# Patient Record
Sex: Male | Born: 1955 | Hispanic: Yes | Marital: Married | Smoking: Never smoker
Health system: Southern US, Community
[De-identification: ages and names within clinical notes are randomized; demographics above are authoritative.]

## PROBLEM LIST (undated history)

## (undated) DIAGNOSIS — K635 Polyp of colon: Secondary | ICD-10-CM

## (undated) DIAGNOSIS — K5790 Diverticulosis of intestine, part unspecified, without perforation or abscess without bleeding: Secondary | ICD-10-CM

## (undated) DIAGNOSIS — D369 Benign neoplasm, unspecified site: Secondary | ICD-10-CM

## (undated) DIAGNOSIS — K5792 Diverticulitis of intestine, part unspecified, without perforation or abscess without bleeding: Secondary | ICD-10-CM

## (undated) HISTORY — DX: Benign neoplasm, unspecified site: D36.9

## (undated) HISTORY — DX: Diverticulosis of intestine, part unspecified, without perforation or abscess without bleeding: K57.90

## (undated) HISTORY — DX: Polyp of colon: K63.5

## (undated) HISTORY — DX: Diverticulitis of intestine, part unspecified, without perforation or abscess without bleeding: K57.92

---

## 2010-07-19 ENCOUNTER — Other Ambulatory Visit: Payer: Self-pay | Admitting: Specialist

## 2010-07-19 ENCOUNTER — Ambulatory Visit
Admission: RE | Admit: 2010-07-19 | Discharge: 2010-07-19 | Disposition: A | Discharge status: Home / Self Care | Payer: BC Managed Care – PPO | Source: Ambulatory Visit | Attending: Specialist | Admitting: Specialist

## 2010-07-19 DIAGNOSIS — M25539 Pain in unspecified wrist: Secondary | ICD-10-CM

## 2012-02-09 IMAGING — CR DG WRIST COMPLETE 3+V*L*
3 series · 3 of 3 positions shown · non-contrast
Comparison: None.

CLINICAL DATA: Left wrist pain

LEFT WRIST - COMPLETE 3+ VIEW

[view not recorded (1 of 3)]
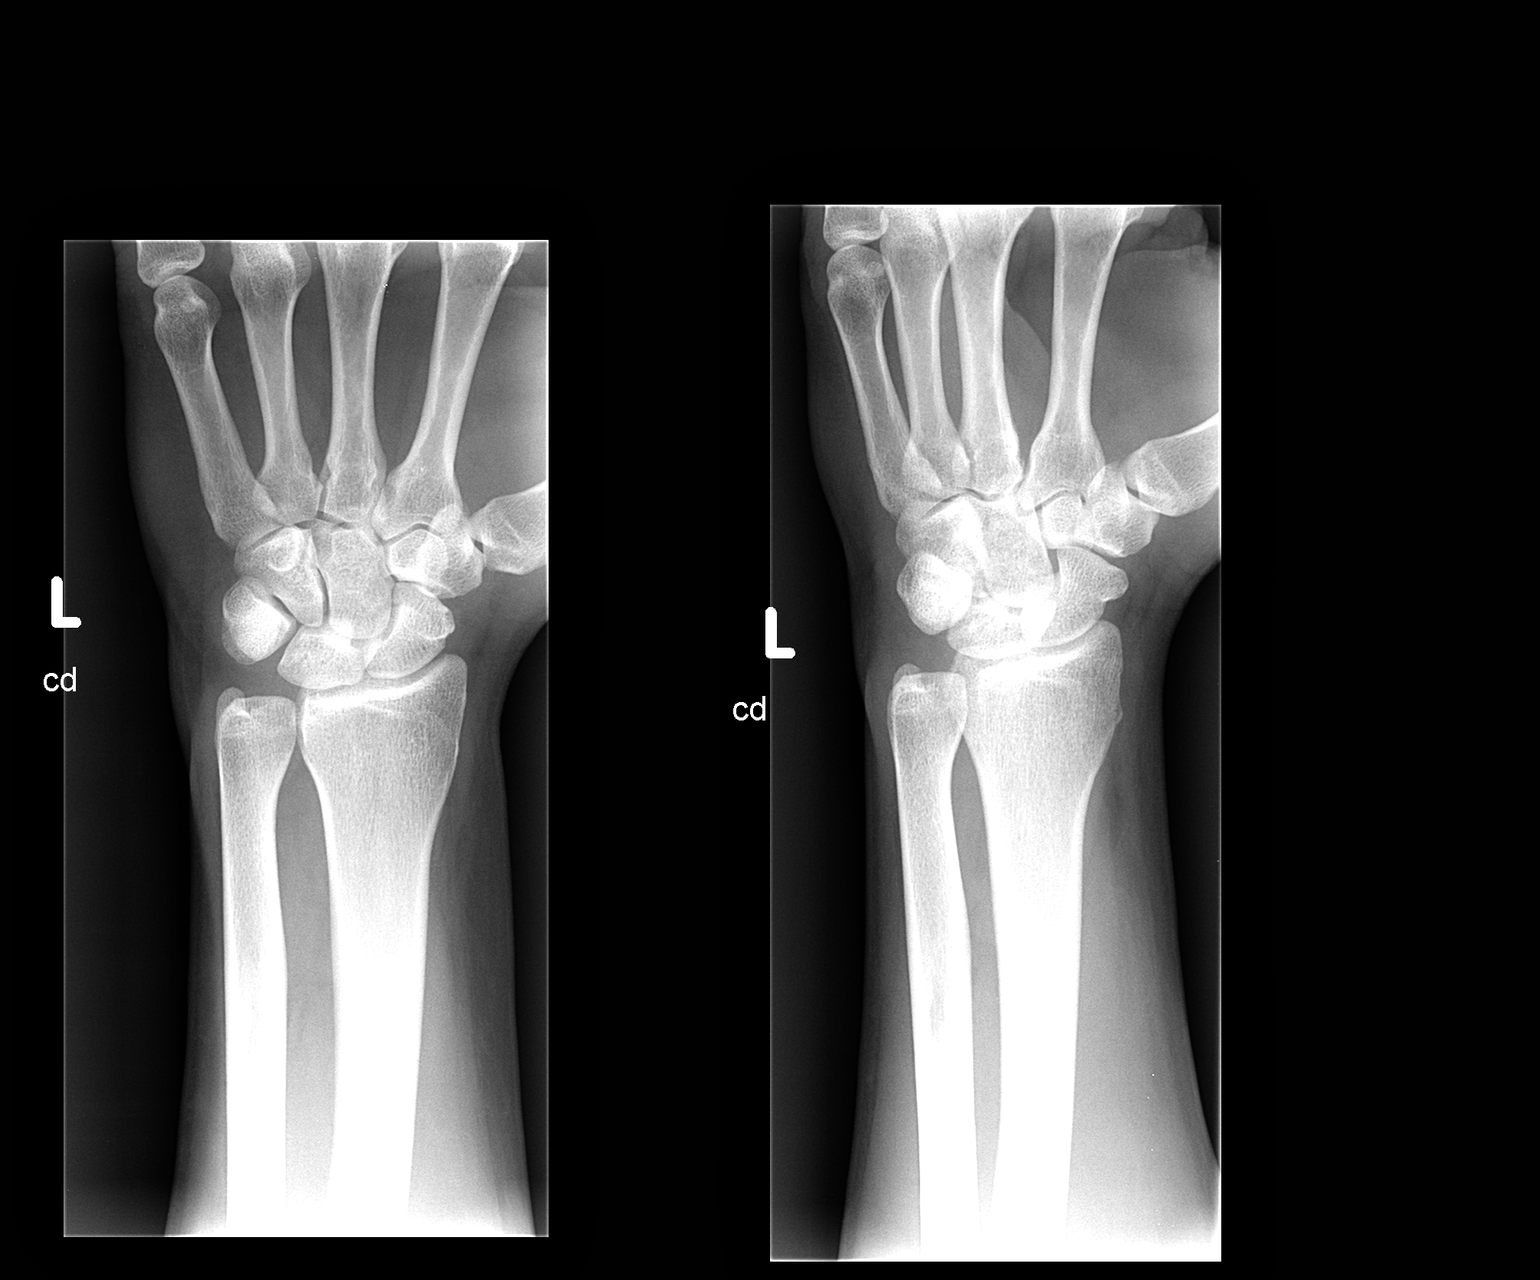

[view not recorded (2 of 3)]
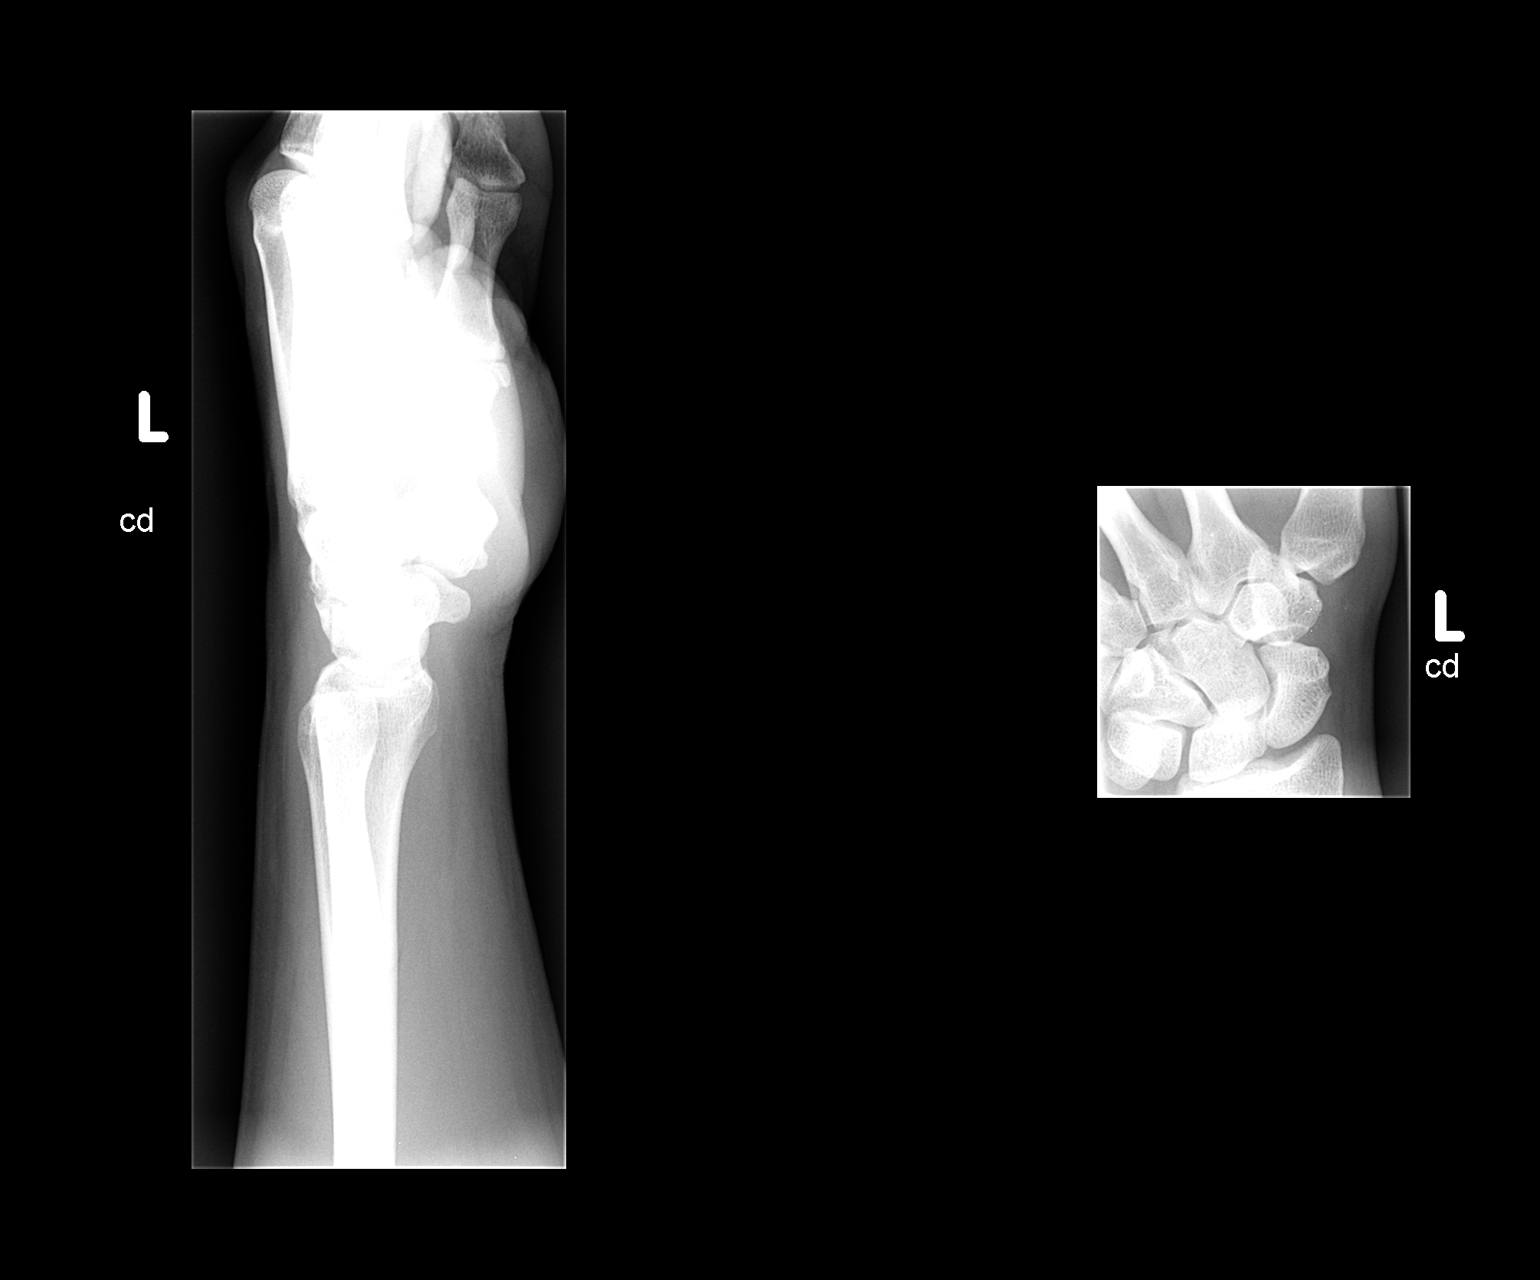

[view not recorded (3 of 3)]
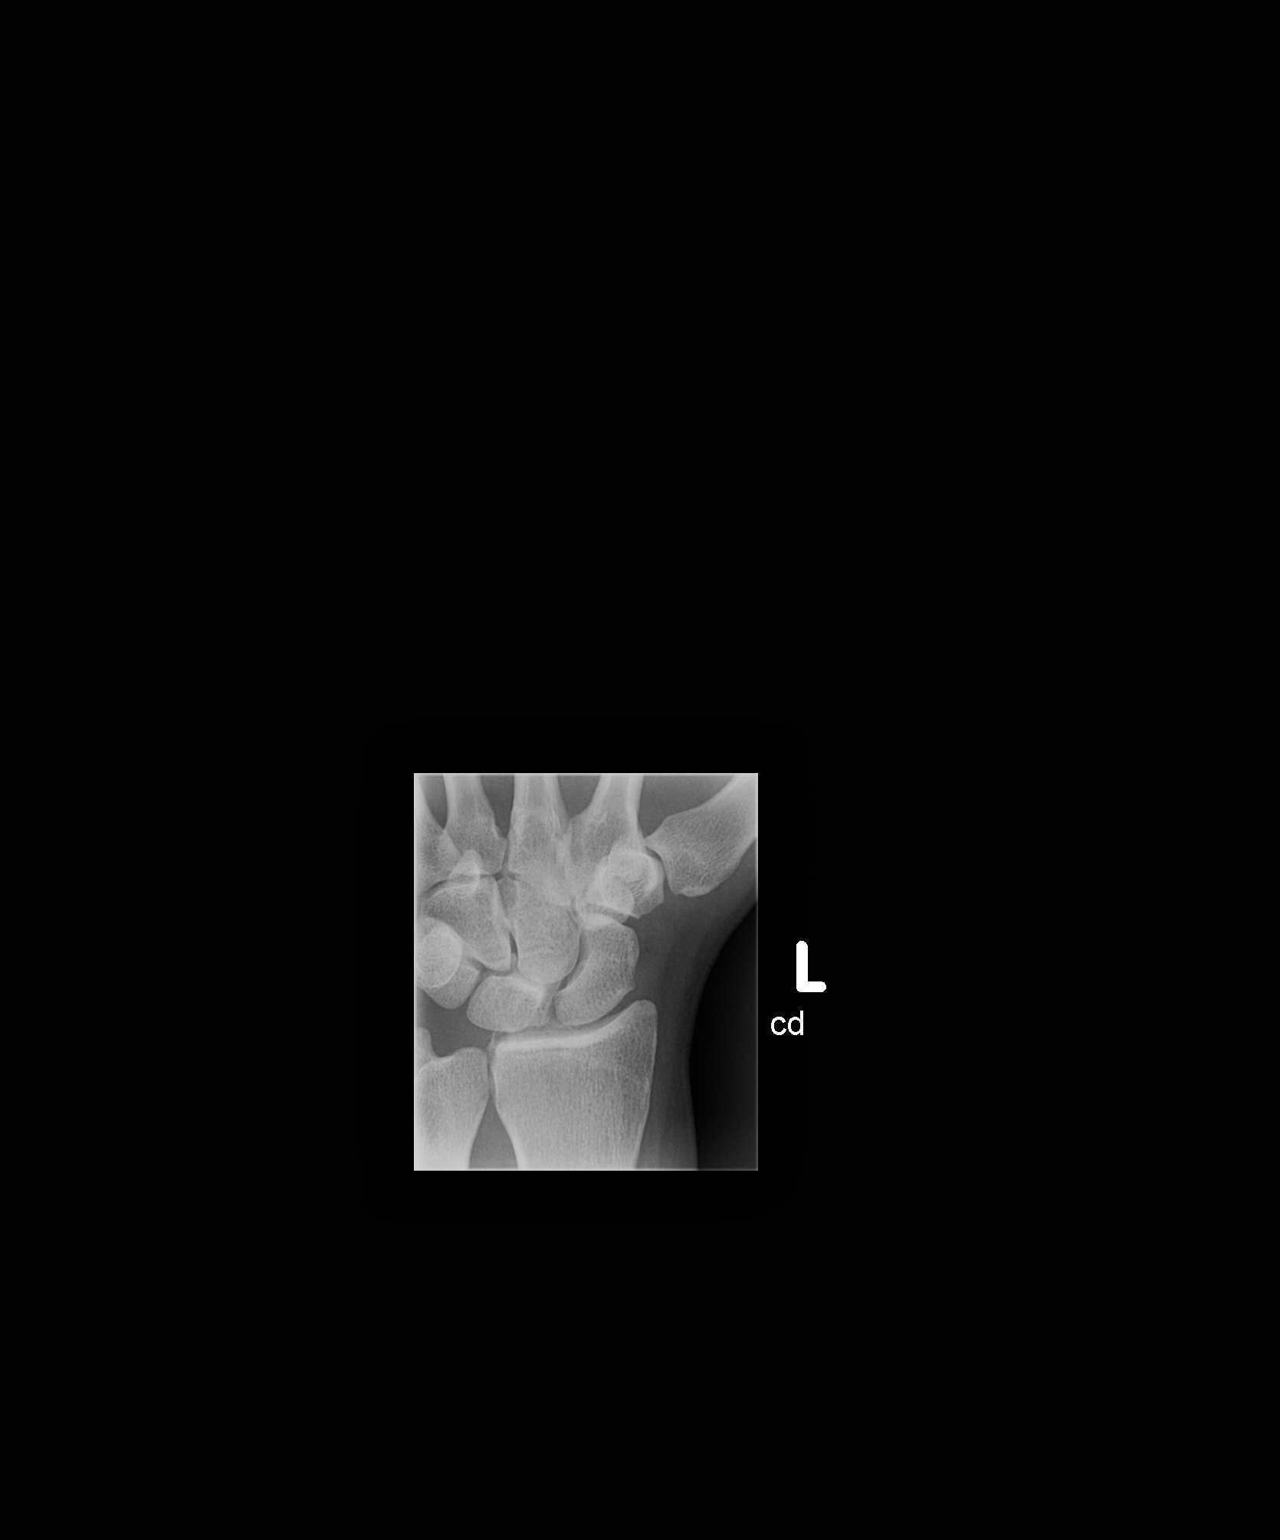

[3 of 3 positions shown; findings below may reference images not displayed]

FINDINGS: Normal alignment.  No fracture.  Intact left distal
radius, ulna and carpal bones.
IMPRESSION: No acute finding.

## 2013-06-21 ENCOUNTER — Emergency Department (HOSPITAL_COMMUNITY): Payer: BC Managed Care – PPO

## 2013-06-21 ENCOUNTER — Emergency Department (HOSPITAL_COMMUNITY)
Admission: EM | Admit: 2013-06-21 | Discharge: 2013-06-21 | Disposition: A | Discharge status: Home / Self Care | Payer: BC Managed Care – PPO | Attending: Emergency Medicine | Admitting: Emergency Medicine

## 2013-06-21 ENCOUNTER — Encounter (HOSPITAL_COMMUNITY): Payer: Self-pay | Admitting: Emergency Medicine

## 2013-06-21 DIAGNOSIS — K5900 Constipation, unspecified: Secondary | ICD-10-CM | POA: Insufficient documentation

## 2013-06-21 DIAGNOSIS — K5732 Diverticulitis of large intestine without perforation or abscess without bleeding: Secondary | ICD-10-CM | POA: Insufficient documentation

## 2013-06-21 DIAGNOSIS — K5792 Diverticulitis of intestine, part unspecified, without perforation or abscess without bleeding: Secondary | ICD-10-CM

## 2013-06-21 DIAGNOSIS — Z79899 Other long term (current) drug therapy: Secondary | ICD-10-CM | POA: Insufficient documentation

## 2013-06-21 LAB — CBC WITH DIFFERENTIAL/PLATELET
Basophils Absolute: 0 10*3/uL (ref 0.0–0.1)
Basophils Relative: 0 % (ref 0–1)
Eosinophils Absolute: 0.2 10*3/uL (ref 0.0–0.7)
Eosinophils Relative: 2 % (ref 0–5)
HEMATOCRIT: 40.8 % (ref 39.0–52.0)
HEMOGLOBIN: 14.2 g/dL (ref 13.0–17.0)
Lymphocytes Relative: 8 % — ABNORMAL LOW (ref 12–46)
Lymphs Abs: 1.3 10*3/uL (ref 0.7–4.0)
MCH: 28.3 pg (ref 26.0–34.0)
MCHC: 34.8 g/dL (ref 30.0–36.0)
MCV: 81.4 fL (ref 78.0–100.0)
MONO ABS: 1.1 10*3/uL — AB (ref 0.1–1.0)
MONOS PCT: 7 % (ref 3–12)
NEUTROS ABS: 13 10*3/uL — AB (ref 1.7–7.7)
Neutrophils Relative %: 83 % — ABNORMAL HIGH (ref 43–77)
Platelets: 206 10*3/uL (ref 150–400)
RBC: 5.01 MIL/uL (ref 4.22–5.81)
RDW: 14.6 % (ref 11.5–15.5)
WBC: 15.7 10*3/uL — ABNORMAL HIGH (ref 4.0–10.5)

## 2013-06-21 LAB — COMPREHENSIVE METABOLIC PANEL
ALT: 35 U/L (ref 0–53)
AST: 43 U/L — ABNORMAL HIGH (ref 0–37)
Albumin: 3.9 g/dL (ref 3.5–5.2)
Alkaline Phosphatase: 94 U/L (ref 39–117)
BILIRUBIN TOTAL: 0.5 mg/dL (ref 0.3–1.2)
BUN: 18 mg/dL (ref 6–23)
CHLORIDE: 101 meq/L (ref 96–112)
CO2: 21 mEq/L (ref 19–32)
CREATININE: 0.94 mg/dL (ref 0.50–1.35)
Calcium: 9.4 mg/dL (ref 8.4–10.5)
Glucose, Bld: 136 mg/dL — ABNORMAL HIGH (ref 70–99)
Potassium: 4 mEq/L (ref 3.7–5.3)
Sodium: 138 mEq/L (ref 137–147)
Total Protein: 7.9 g/dL (ref 6.0–8.3)

## 2013-06-21 LAB — URINE MICROSCOPIC-ADD ON

## 2013-06-21 LAB — URINALYSIS, ROUTINE W REFLEX MICROSCOPIC
Bilirubin Urine: NEGATIVE
Glucose, UA: NEGATIVE mg/dL
KETONES UR: NEGATIVE mg/dL
LEUKOCYTES UA: NEGATIVE
NITRITE: NEGATIVE
Protein, ur: NEGATIVE mg/dL
Specific Gravity, Urine: 1.021 (ref 1.005–1.030)
UROBILINOGEN UA: 0.2 mg/dL (ref 0.0–1.0)
pH: 6 (ref 5.0–8.0)

## 2013-06-21 LAB — LIPASE, BLOOD: LIPASE: 38 U/L (ref 11–59)

## 2013-06-21 MED ORDER — CIPROFLOXACIN IN D5W 400 MG/200ML IV SOLN
400.0000 mg | Freq: Once | INTRAVENOUS | Status: AC
  Administered 2013-06-21: 400 mg via INTRAVENOUS
  Filled 2013-06-21: qty 200

## 2013-06-21 MED ORDER — CIPROFLOXACIN HCL 500 MG PO TABS: 500.0000 mg | ORAL_TABLET | Freq: Two times a day (BID) | ORAL | Status: AC

## 2013-06-21 MED ORDER — IOHEXOL 300 MG/ML  SOLN
100.0000 mL | Freq: Once | INTRAMUSCULAR | Status: AC | PRN
  Administered 2013-06-21: 100 mL via INTRAVENOUS

## 2013-06-21 MED ORDER — TRAMADOL HCL 50 MG PO TABS: 50.0000 mg | ORAL_TABLET | Freq: Four times a day (QID) | ORAL | Status: DC | PRN

## 2013-06-21 MED ORDER — MORPHINE SULFATE 4 MG/ML IJ SOLN
2.0000 mg | Freq: Once | INTRAMUSCULAR | Status: AC
  Administered 2013-06-21: 2 mg via INTRAVENOUS
  Filled 2013-06-21: qty 1

## 2013-06-21 MED ORDER — METRONIDAZOLE IN NACL 5-0.79 MG/ML-% IV SOLN
500.0000 mg | Freq: Once | INTRAVENOUS | Status: AC
  Administered 2013-06-21: 500 mg via INTRAVENOUS
  Filled 2013-06-21: qty 100

## 2013-06-21 MED ORDER — SENNOSIDES-DOCUSATE SODIUM 8.6-50 MG PO TABS: 2.0000 | ORAL_TABLET | Freq: Every day | ORAL | Status: DC

## 2013-06-21 MED ORDER — SODIUM CHLORIDE 0.9 % IV BOLUS (SEPSIS)
1000.0000 mL | Freq: Once | INTRAVENOUS | Status: AC
Start: 2013-06-21 — End: 2013-06-21
  Administered 2013-06-21: 1000 mL via INTRAVENOUS

## 2013-06-21 MED ORDER — ACETAMINOPHEN 500 MG PO TABS
1000.0000 mg | ORAL_TABLET | Freq: Once | ORAL | Status: AC
  Administered 2013-06-21: 1000 mg via ORAL
  Filled 2013-06-21: qty 2

## 2013-06-21 MED ORDER — METRONIDAZOLE 500 MG PO TABS: 500.0000 mg | ORAL_TABLET | Freq: Two times a day (BID) | ORAL | Status: AC

## 2013-06-21 NOTE — Discharge Instructions (Signed)
Diverticulitis  (Diverticulitis)   Un divertículo es una pequeña bolsa o saco en el colon. La diverticulosis es la presencia de divertículos en el colon. Es la irritación (inflamación) o infección de los divertículos.   CAUSAS  Ciertos gérmenes (bacterias) colonizan el colon y los divertículos. Si algunas partículas de alimento obstruyen la pequeña abertura de un divertículo, las bacterias que existen en su interior se multiplican y causan un aumento de la presión. Esto produce la infección y la inflamación denominada diverticulitis.  SÍNTOMAS  · Dolor y molestias en el vientre (abdomen). Por lo general el dolor se ubica en la zona inferior izquierda del abdomen. Sin embargo, podría estar en cualquier otra parte.  · Fiebre.  · Hinchazón.  · Ganas de vomitar (náuseas).  · Vómitos.  · Heces anormales.  DIAGNÓSTICO  La historia clínica y el examen físico ayudarán a diagnosticar la enfermedad. Será necesario realizar otras pruebas ya que muchas cosas pueden causar dolor abdominal. Estas pruebas son:  · Pruebas de sangre.  · Pruebas de orina.  · Radiografías de abdomen.  · Tomografías computarizadas de abdomen.  En algunos casos se indicará cirugía para determinar si los síntomas se deben a diverticulitis o a otros trastornos.  TRATAMIENTO  En la mayor parte de los casos se trata sin cirugía. El tratamiento incluye:  · Reposo del intestino restringiendo la alimentación sólo a líquidos durante algunos días. Cuando mejore, deberá seguir una dieta baja en fibras.  · Si está deshidratado le administrarán líquidos por vía endovenosa.  · Le indicarán antibióticos para tratar la infección.  · Medicamentos para el dolor y las náuseas en caso de ser necesario.  · Se indica la cirugía si el divertículo inflamado se ha perforado.  INSTRUCCIONES PARA EL CUIDADO DOMICILIARIO  · Siga una dieta líquida (caldo, té o agua durante el tiempo que se lo indique su médico). Podrá gradualmente comenzar una dieta baja en fibras según lo que  tolere. Una dieta baja en fibras es la que contiene menos de 10 gramos de fibra. Elija entre los alimentos a continuación para reducir la cantidad de fibra de la dieta:  · Pan Percival, cereales, arroz y pastas.  · Frutas y vegetales cocidos o frutas frescas blandas y vegetales sin piel.  · Carne molida o un bife tierno bien cocido, jamón, ternera, cordero, cerdo o aves.  · Huevos y frutos de mar.  · Luego que los síntomas de la diverticulitis hayan mejorado, el médico podrá indicarle una dieta con más cantidad de fibra. Este tipo de dieta incluye 14 gramos de fibra cada 1000 calorías consumidas. Para una dieta estándar de 2000 calorías se necesitan 28 gramos de fibra. Siga las pautas para esta dieta para aumentar la ingesta de fibra. Es importante que aumente lentamente la cantidad de fibra para evitar los gases, la constipación y la hinchazón.  · Elija panes, cereales, pastas con salvado y arroz marrón.  · Elija frutas y vegetales frescos con su piel. No cocine demasiado los vegetales porque cuánto más los cocine, más fibra pierden.  · Consuma más frutos secos, semillas, legumbres, arvejas secas, lentejas.  · Busque en las etiquetas de Información Nut[employer]ional, los productos que tengan más de 3 gramos de fibra por porción.  · Tome todos los medicamentos tal como se lo indicó el profesional que lo asiste.  · Si el médico le ha dado fecha para una visita de control, es importante que concurra. No cumplir con este control puede dar como resultado que el daño, el   dolor o la discapacidad sean permanentes(crónicos) . Si hay algún problema para cumplir con los controles, comuníquelo a su médico.  SOLICITE ATENCIÓN MÉDICA SI:  · El dolor no desaparece según lo esperado.  · No puede pasar de la dieta líquida.  · Los movimientos intestinales no vuelven a la normalidad.  SOLICITE ATENCIÓN MÉDICA DE INMEDIATO SI:  · El dolor se agrava.  · Usted tiene una temperatura oral de más de 38,9° C (102° F) y no puede controlarla con  medicamentos.  · Presenta vómitos repetidas veces.  · Observa la materia fecal tiene aspecto negro alquitranado o contiene sangre de color rojo brillante.  · Si los síntomas que lo trajeron a la consulta empeoran o no mejoran.  ESTÉ SEGURO QUE:   · Comprende las instrucciones para el alta médica.  · Controlará su enfermedad.  · Solicitará atención médica de inmediato según las indicaciones.  Document Released: 10/19/2004 Document Revised: 04/03/2011  Exit[employer]® Patient Information ©2014 [employer].

## 2013-06-21 NOTE — ED Provider Notes (Signed)
CSN: [id number]     Arrival date & time 06/21/13  0625 History   First MD Initiated Contact with Patient 06/21/13 [phone]     Chief Complaint  Patient presents with  . Abdominal Pain     (Consider location/radiation/quality/duration/timing/severity/associated sxs/prior Treatment) HPI Patient is a new abdominal pain.  The pain is lower abdominal, with bloating sensation, worsening with liquid intake, slight improvement with flatus. Decreased bowel movements recently, no vomiting. Patient complains of chills, no objective fever. No concurrent chest pain, dyspnea, confusion, disorientation, or other focal complaints.     History reviewed. No pertinent past medical history. History reviewed. No pertinent past surgical history. No family history on file. History  Substance Use Topics  . Smoking status: Never Smoker   . Smokeless tobacco: Not on file  . Alcohol Use: No    Review of Systems  Constitutional:       Per HPI, otherwise negative  HENT:       Per HPI, otherwise negative  Respiratory:       Per HPI, otherwise negative  Cardiovascular:       Per HPI, otherwise negative  Gastrointestinal: Positive for nausea, abdominal pain, constipation and abdominal distention. Negative for vomiting.  Endocrine:       Negative aside from HPI  Genitourinary:       Neg aside from HPI   Musculoskeletal:       Per HPI, otherwise negative  Skin: Negative.   Neurological: Negative for syncope.      Allergies  Review of patient's allergies indicates no known allergies.  Home Medications   Prior to Admission medications   Medication Sig Start Date End Date Taking? Authorizing Provider  Phenylephrine-Pheniramine-DM [facility] COLD & COUGH) 11-12-18 MG PACK Take 1 packet by mouth daily as needed (for cough/cold).   Yes Historical Provider, MD   BP 113/63  Pulse 116  Temp(Src) 102.9 F (39.4 C) (Oral)  Resp 25  Ht 5\' 9"  (1.753 m)  Wt 175 lb (79.379 kg)  BMI 25.83 kg/m2  SpO2  97% Physical Exam  Nursing note and vitals reviewed. Constitutional: He is oriented to person, place, and time. He appears well-developed. No distress.  HENT:  Head: Normocephalic and atraumatic.  Eyes: Conjunctivae and EOM are normal.  Cardiovascular: Normal rate and regular rhythm.   Pulmonary/Chest: Effort normal. No stridor. No respiratory distress.  Abdominal: Soft. He exhibits no distension. There is tenderness.  Musculoskeletal: He exhibits no edema.  Neurological: He is alert and oriented to person, place, and time.  Skin: Skin is warm and dry.  Psychiatric: He has a normal mood and affect.    ED Course  Procedures (including critical care time) Labs Review Labs Reviewed  CBC WITH DIFFERENTIAL - Abnormal; Notable for the following:    WBC 15.7 (*)    Neutrophils Relative % 83 (*)    Neutro Abs 13.0 (*)    Lymphocytes Relative 8 (*)    Monocytes Absolute 1.1 (*)    All other components within normal limits  COMPREHENSIVE METABOLIC PANEL - Abnormal; Notable for the following:    Glucose, Bld 136 (*)    AST 43 (*)    All other components within normal limits  URINALYSIS, ROUTINE W REFLEX MICROSCOPIC - Abnormal; Notable for the following:    Hgb urine dipstick SMALL (*)    All other components within normal limits  LIPASE, BLOOD  URINE MICROSCOPIC-ADD ON    Imaging Review Ct Abdomen Pelvis W Contrast  06/21/2013  CLINICAL DATA:  Lower abdominal pain  EXAM: CT ABDOMEN AND PELVIS WITH CONTRAST  TECHNIQUE: Multidetector CT imaging of the abdomen and pelvis was performed using the standard protocol following bolus administration of intravenous contrast.  CONTRAST:  20mL OMNIPAQUE IOHEXOL 300 MG/ML  SOLN  COMPARISON:  None.  FINDINGS: Lung bases are free of acute infiltrate or sizable effusion. The liver, gallbladder, spleen, adrenal glands and pancreas are all normal in their CT appearance. A few tiny hypodensities are noted adjacent to the hepatic veins in the dome of the  liver on the right. These likely represent small cysts but are too small for adequate characterization. Kidneys are well visualized with a normal enhancement pattern. Delayed images demonstrate normal excretion of contrast material.  The appendix is well visualized. There is diffuse wall thickening within the sigmoid colon with significant pericolonic inflammatory change consistent with acute diverticulitis. No abscess is noted at this time. No findings of perforation are seen. The bladder is partially distended. No pelvic mass lesion or sidewall abnormality is noted. Bony structures are within normal limits for the patient's age.  IMPRESSION: Findings of acute diverticulitis within the sigmoid colon. No abscess or perforation is identified. Followup scanning following appropriate therapy is recommended to assess for resolution.   Electronically Signed   By: Inez Catalina M.D.   On: 06/21/2013 09:50   Cardiac monitor 110 sinus tachycardia, abnormal Pulse oximetry 100% room air normal   10:04 AM Patient awake and alert, no new complaints. He and his daughter are aware of all results.  MDM   Patient presents with new abdominal pain, decreased bowel movements, chills.  Patient's evaluation demonstrates diverticulitis. Patient mildly tachycardic, but awake and alert, in no distress.  Patient received initial antibiotics here, with no decompensation, general improvement in his condition, was discharged to follow up with his gastroenterologist.     Carmin Muskrat, MD 06/21/13 [phone]

## 2013-06-21 NOTE — ED Notes (Signed)
Abd pain that started yesterday, thought it was constipation. Drank tea and had normal bowel movement, pain still present but less. Denies N/V/D

## 2013-06-21 NOTE — ED Notes (Signed)
Pt drinking oral contrast for CT scan

## 2013-06-23 ENCOUNTER — Encounter: Payer: Self-pay | Admitting: Internal Medicine

## 2013-06-27 ENCOUNTER — Encounter: Payer: Self-pay | Admitting: *Deleted

## 2013-07-11 ENCOUNTER — Ambulatory Visit (INDEPENDENT_AMBULATORY_CARE_PROVIDER_SITE_OTHER): Payer: BC Managed Care – PPO | Admitting: Internal Medicine

## 2013-07-11 ENCOUNTER — Encounter: Payer: Self-pay | Admitting: Internal Medicine

## 2013-07-11 VITALS — BP 98/62 | HR 80 | Ht 65.75 in | Wt 169.1 lb

## 2013-07-11 DIAGNOSIS — R933 Abnormal findings on diagnostic imaging of other parts of digestive tract: Secondary | ICD-10-CM

## 2013-07-11 DIAGNOSIS — K5732 Diverticulitis of large intestine without perforation or abscess without bleeding: Secondary | ICD-10-CM

## 2013-07-11 MED ORDER — MOVIPREP 100 G PO SOLR: 1.0000 | Freq: Once | ORAL | Status: DC

## 2013-07-11 NOTE — Progress Notes (Signed)
Gary Lynch 12-16-1955 [id number]  Note: This dictation was prepared with Dragon digital system. Any transcriptional errors that result from this procedure are unintentional.   History of Present Illness:  This is a 58 year old [place] male who comes with his daughter who speaks [place]. He experienced an episode of acute left middle quadrant abdominal pain which occurred at night several weeks ago and he was evaluated in the emergency room on 06/21/2013 where a CT scan showed diffuse wall thickening of the sigmoid colon consistent with diverticulitis. His white blood cell count was elevated at 15,700. He has never had a similar episode before. There was no radiographic evidence of abscess or peritonitis. 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 Benson Norway then referred to [facility] where Dr.Gilliam removed the polyp with a combination of snare and cautery. There was no dysplasia. He is now  due for a repeat colonoscopy . An additional sigmoid polyp was removed at that time which was a tubular adenoma. There is no family history of colon cancer. He is not aware of a family history of diverticulosis. He denies weight loss. He has had chronic constipation but since the episode of diverticulitis, he has been drinking prune juice and eating fruit so his bowel habits have been normal.     Past Medical History  Diagnosis Date  . Diverticulitis   . Colon polyps     Dr. Benson [place] and [facility]    History reviewed. No pertinent past surgical history.  No Known Allergies  Family history and social history have been reviewed.  Review of Systems:   The remainder of the 10 point ROS is negative except as outlined in the H&P  Physical Exam: General Appearance Well developed, in no distress, speaks little bit of English  Eyes  Non icteric  HEENT  Non traumatic, normocephalic  Mouth No lesion, tongue papillated, no cheilosis Neck Supple  without adenopathy, thyroid not enlarged, no carotid bruits, no JVD Lungs Clear to auscultation bilaterally COR Normal S1, normal S2, regular rhythm, no murmur, quiet precordium Abdomen Soft with minimal tenderness in left lower quadrant. No distention no rebound  Rectal Soft Hemoccult negative stool. 2+ prostate  Extremities  No pedal edema Skin No lesions Neurological Alert and oriented x 3 Psychological Normal mood and affect  Assessment and Plan:   Problem #28 58 year old Hispanic male with first episode of diverticulitis documented on a CT scan of the abdomen. He responded to Cipro and Flagyl for 10 days. He is now 100% improved. He had chronic constipation prior to the attack. I have instructed him on a high fiber diet and will have him start on Metamucil 1 heaping teaspoon daily. We discussed  diverticulosis.  Problem #2 History of villous adenoma of the cecum. Patient is due for repeat colonoscopy. We will go ahead and schedule a colonoscopy with a routine prep. I have discussed the sedation, prep and the procedure.    Gary Lynch 07/11/2013

## 2013-07-11 NOTE — Patient Instructions (Signed)
You have been scheduled for a colonoscopy. Please follow written instructions given to you at your visit today.  Please pick up your prep kit at the pharmacy within the next 1-3 days. If you use inhalers (even only as needed), please bring them with you on the day of your procedure. Your physician has requested that you go to www.[email or web address].com and enter the access code given to you at your visit today. This web site gives a general overview about your procedure. However, you should still follow specific instructions given to you by our office regarding your preparation for the procedure.  Please follow a high fiber diet.  Please purchase Metamucil over the counter. Take 1 heaping teaspoon every day.  Dieta con alto contenido de fibra  (High [employer] Diet) La fibra se encuentra en frutas, verduras y granos. Una dieta con alto contenido en fibras se favorece con la adicin de ms granos enteros, legumbres, frutas y verduras en su dieta. La cantidad recomendada de fibra para los hombres adultos es de 38 g por da. Para las mujeres adultas es de 25 g por da. Las [employer] y las que amamantan deben consumir 35 gramos de fibra por Training and development officer. Si usted tiene un problema digestivo o intestinal, consulte a su mdico antes de la adicin de alimentos ricos en fibra a su dieta. Coma una variedad de alimentos ricos en fibra en lugar de slo unos pocos.  OBJETIVO   Aumentar la masa fecal.  Tener deposiciones ms regulares para evitar el estreimiento.  Reducir el colesterol.  Para evitar comer en exceso. [place]?   En caso de estreimiento y hemorroides.  En caso de diverticulosis no complicada (enfermedad intestinal) y en el sndrome del colon irritable.  Si necesita ayuda para el control de [place].  Si desea mejorar su dieta como medida de proteccin contra la aterosclerosis, la diabetes y Science writer. [place] y cereales integrales.  Frutas, como las  [place], [place], pltanos, fresas, Development worker, community y peras.  Verduras, como guisantes, zanahorias, batatas, remolachas, brcoli, repollo, espinacas y alcauciles.  Legumbres, las arvejas, soja, lentejas.  Almendras. CONTENIDO DE FIBRA DE LOS ALIMENTOS  Almidones y granos / Heritage manager (g)   Cheerios, 1 taza / 3 g  Corn Flakes, 1 taza / 0,7 g  Arroz inflado, 1  tazas / 0,3 g  Harina de avena instantnea (cocida),  taza / 2 g  Cereal de trigo escarchado, 1 taza / 5,1 g  Arroz marrn grano largo (cocido), 1 taza / 3,5 g  Arroz Dreisbach grano largo (cocido), 1 taza / 0,6 g  Macarrones enriquecidos (cocidos), 1 taza / 2,5 g Legumbres / Fibra Diettica (g)   Frijoles cocidos (enlatados, crudos o vegetarianos),  taza / 5,2 g  Frijoles (enlatados),  taza / 6,8 g  Frijoles pintos (cocidos),  taza / 5,5 g Panes y Administrator / Heritage manager (g)   Galletas de graham o miel, 2 plazas / 0,7 g  Galletitas saladas, 3 unidades / 0,3 g  Pretzels salados comunes, 10 pedazos / 1,8 g  Pan integral, 1 rebanada / 1,9 g  Pan Linders, 1 rebanada / 0,7 g  Pan con pasas, 1 rebanada / 1,2 g  Bagel 3 oz / 2 g  Tortilla de harina, 1 oz / 0.9 g  Tortilla de maz, 1 pequea / 1,5 g  Pan de amburguesa o hot dog, 1 pequeo / 0,9 g Frutas / Fibra Diettica (g)   Manzana con  piel, 1 mediana / 4,4 g  Pur de manzana endulzado,  taza / 1,5 g  [place],  mediano / 1,5 g  Uvas, 10 uvas / 0,4 g  Naranja, 1 pequea / 2,3 g  Pasas, 1,5 oz / 1.6 g  Meln, 1 taza / 1,4 g Vegetales / Fibra Diettica (g)   Judas verdes (en conserva),  taza / 1,3 g  Zanahorias (cocido),  taza / 2,3 g  Broccoli (cocido),  taza / 2,8 g  Guisantes (cocidos),  taza / 4,4 g  Pur de papas,  taza / 1,6 g  Lechuga, 1 taza / 0,5 g  Maz (en lata),  taza / 1,6 g  Tomate,  taza / 1,1 g 1 cup / 3 g. Document Released: 01/09/2005 Document Revised: 07/11/2011 [facility] Patient Information 2015  [place], [place]. This information is not intended to replace advice given to you by your health care provider. Make sure you discuss any questions you have with your health care provider.

## 2013-07-18 ENCOUNTER — Encounter: Payer: Self-pay | Admitting: Internal Medicine

## 2013-09-18 ENCOUNTER — Telehealth: Payer: Self-pay | Admitting: Internal Medicine

## 2013-09-18 NOTE — Telephone Encounter (Signed)
Per [facility], they already have Moviprep for patient. Patient advised.

## 2013-09-19 ENCOUNTER — Ambulatory Visit (AMBULATORY_SURGERY_CENTER): Payer: BC Managed Care – PPO | Admitting: Internal Medicine

## 2013-09-19 ENCOUNTER — Encounter: Payer: Self-pay | Admitting: Internal Medicine

## 2013-09-19 VITALS — BP 102/64 | HR 66 | Temp 97.9°F | Resp 20 | Ht 65.0 in | Wt 169.0 lb

## 2013-09-19 DIAGNOSIS — K5732 Diverticulitis of large intestine without perforation or abscess without bleeding: Secondary | ICD-10-CM

## 2013-09-19 DIAGNOSIS — D124 Benign neoplasm of descending colon: Secondary | ICD-10-CM

## 2013-09-19 DIAGNOSIS — D126 Benign neoplasm of colon, unspecified: Secondary | ICD-10-CM

## 2013-09-19 DIAGNOSIS — D122 Benign neoplasm of ascending colon: Secondary | ICD-10-CM

## 2013-09-19 MED ORDER — SODIUM CHLORIDE 0.9 % IV SOLN: 500.0000 mL | INTRAVENOUS | Status: DC

## 2013-09-19 NOTE — Op Note (Signed)
[place]  [address] [place], [address]   COLONOSCOPY PROCEDURE REPORT  PATIENT: Gary Lynch, Gary Lynch  MR#: [id number] BIRTHDATE: [birth year] , 22  yrs. old GENDER: Male ENDOSCOPIST: Lafayette Dragon, MD REFERRED EY:CXKG PROCEDURE DATE:  09/19/2013 PROCEDURE:   Colonoscopy with cold biopsy polypectomy First Screening Colonoscopy - Avg.  risk and is 50 yrs.  old or older - No.  Prior Negative Screening - Now for repeat screening. N/A  History of Adenoma - Now for follow-up colonoscopy & has been > or = to 3 yrs.  Yes hx of adenoma.  Has been 3 or more years since last colonoscopy.  Polyps Removed Today? Yes. ASA CLASS:   Class II INDICATIONS:recent episode of diverticulitis,documented on a CT scan of the abdomen,, now resolved, history of villous adenoma of the cecum removed on colonoscopy in 2011 at [place]: MAC sedation, administered by CRNA and propofol (Diprivan) 300mg  IV  DESCRIPTION OF PROCEDURE:   After the risks benefits and alternatives of the procedure were thoroughly explained, informed consent was obtained.  A digital rectal exam revealed no abnormalities of the rectum.   The LB [id number]  endoscope was introduced through the anus and advanced to the cecum, which was identified by both the appendix and ileocecal valve. No adverse events experienced.   The quality of the prep was good, using MoviPrep  The instrument was then slowly withdrawn as the colon was fully examined.      COLON FINDINGS: Two diminutive sessile polyps were found in the ascending colon and descending colon.  A polypectomy was performed with cold forceps.  The resection was complete and the polyp tissue was completely retrieved.   There was moderate diverticulosis noted in the descending colon with associated angulation, tortuosity and muscular hypertrophy.  Retroflexed views revealed no abnormalities. The time to cecum=3 minutes 38 seconds.   Withdrawal time=9 minutes 180 seconds.  The scope was withdrawn and the procedure completed. COMPLICATIONS: There were no complications.  ENDOSCOPIC IMPRESSION: 1.   Two diminutive sessile polyps were found in the ascending colon and descending colon at 50 cm,  polypectomy was performed with cold forceps 2.   There was moderate diverticulosis noted in the descending colon,no evidence of obstruction or diverticulitis  RECOMMENDATIONS: 1.  Await pathology results 2.  high fiber diet Metamucil 1 teaspoon daily Recall colonoscopy in 5 years   eSigned:  Lafayette Dragon, MD 09/19/2013 11:48 AM   cc:   PATIENT NAME:  Gary Lynch, Gary Lynch MR#: [id number]

## 2013-09-19 NOTE — Progress Notes (Signed)
Procedure ends, to recovery, report given and VSS. 

## 2013-09-19 NOTE — Patient Instructions (Signed)
YOU HAD AN ENDOSCOPIC PROCEDURE TODAY AT THE [facility]: Refer to the procedure report that was given to you for any specific questions about what was found during the examination.  If the procedure report does not answer your questions, please call your gastroenterologist to clarify.  If you requested that your care partner not be given the details of your procedure findings, then the procedure report has been included in a sealed envelope for you to review at your convenience later.  YOU SHOULD EXPECT: Some feelings of bloating in the abdomen. Passage of more gas than usual.  Walking can help get rid of the air that was put into your GI tract during the procedure and reduce the bloating. If you had a lower endoscopy (such as a colonoscopy or flexible sigmoidoscopy) you may notice spotting of blood in your stool or on the toilet paper. If you underwent a bowel prep for your procedure, then you may not have a normal bowel movement for a few days.  DIET: Your first meal following the procedure should be a light meal and then it is ok to progress to your normal diet.  A half-sandwich or bowl of soup is an example of a good first meal.  Heavy or fried foods are harder to digest and may make you feel nauseous or bloated.  Likewise meals heavy in dairy and vegetables can cause extra gas to form and this can also increase the bloating.  Drink plenty of fluids but you should avoid alcoholic beverages for 24 hours.  ACTIVITY: Your care partner should take you home directly after the procedure.  You should plan to take it easy, moving slowly for the rest of the day.  You can resume normal activity the day after the procedure however you should NOT DRIVE or use heavy machinery for 24 hours (because of the sedation medicines used during the test).    SYMPTOMS TO REPORT IMMEDIATELY: A gastroenterologist can be reached at any hour.  During normal business hours, 8:30 AM to 5:00 PM Monday through Friday,  call [phone].  After hours and on weekends, please call the GI answering service at [phone] who will take a message and have the physician on call contact you.   Following lower endoscopy (colonoscopy or flexible sigmoidoscopy):  Excessive amounts of blood in the stool  Significant tenderness or worsening of abdominal pains  Swelling of the abdomen that is new, acute  Fever of 100F or higher   FOLLOW UP: If any biopsies were taken you will be contacted by phone or by letter within the next 1-3 weeks.  Call your gastroenterologist if you have not heard about the biopsies in 3 weeks.  Our staff will call the home number listed on your records the next business day following your procedure to check on you and address any questions or concerns that you may have at that time regarding the information given to you following your procedure. This is a courtesy call and so if there is no answer at the home number and we have not heard from you through the emergency physician on call, we will assume that you have returned to your regular daily activities without incident.  SIGNATURES/CONFIDENTIALITY: You and/or your care partner have signed paperwork which will be entered into your electronic medical record.  These signatures attest to the fact that that the information above on your After Visit Summary has been reviewed and is understood.  Full responsibility of the confidentiality of   this discharge information lies with you and/or your care-partner.   INFORMATION ON POLYPS ,DIVERTICULOSIS ,AND HIGH FIBER DIET GIVEN TO YOU TODAY  METAMUCIL OR BENEFIBER (OVER THE COUNTER) DAILY AS DR Olevia Perches DISCUSSED WITH YOU

## 2013-09-19 NOTE — Progress Notes (Signed)
Called to room to assist during endoscopic procedure.  Patient ID and intended procedure confirmed with present staff. Received instructions for my participation in the procedure from the performing physician.  

## 2013-09-22 ENCOUNTER — Telehealth: Payer: Self-pay

## 2013-09-22 NOTE — Telephone Encounter (Signed)
Left message on answering machine. 

## 2013-10-07 ENCOUNTER — Encounter: Payer: Self-pay | Admitting: Internal Medicine

## 2013-10-08 ENCOUNTER — Encounter: Payer: Self-pay | Admitting: *Deleted

## 2017-05-17 ENCOUNTER — Encounter: Payer: Self-pay | Admitting: *Deleted
